# Patient Record
Sex: Male | Born: 1990 | Race: Black or African American | Hispanic: No | Marital: Single | Smoking: Current every day smoker
Health system: Southern US, Community
[De-identification: ages and names within clinical notes are randomized; demographics above are authoritative.]

---
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
## 2010-01-21 ENCOUNTER — Encounter: Payer: Self-pay | Admitting: Family Medicine

## 2010-02-05 ENCOUNTER — Ambulatory Visit: Payer: Self-pay | Admitting: Family Medicine

## 2010-02-05 DIAGNOSIS — F17200 Nicotine dependence, unspecified, uncomplicated: Secondary | ICD-10-CM | POA: Insufficient documentation

## 2010-02-05 DIAGNOSIS — M25519 Pain in unspecified shoulder: Secondary | ICD-10-CM

## 2011-01-08 NOTE — Miscellaneous (Signed)
Summary: input NP  Clinical Lists Changes  Observations: Added new observation of SOCIAL HX: Lives with great grandmother (doris 3) and cousin (logan 76) High school graduate Smoker (01/21/2010 16:45) Added new observation of FAMILY HX: none (01/21/2010 16:45) Added new observation of PAST SURG HX: none (01/21/2010 16:45) Added new observation of PAST MED HX: none (01/21/2010 16:45) Added new observation of MEDRECON: current updated (01/21/2010 16:45) Added new observation of SEATBELT USE: always (01/21/2010 16:45) Added new observation of REGULAREXERC: yes (01/21/2010 16:45) Added new observation of ALCOHOL USE: 0 /day (01/21/2010 16:45) Added new observation of SMOK STATUS: current  (01/21/2010 16:45)      Habits & Providers  Alcohol-Tobacco-Diet     Alcohol drinks/day: 0     Tobacco Status: current  Exercise-Depression-Behavior     Does Patient Exercise: yes     Seat Belt Use: always   Current Medications (verified): 1)  None   Past History:  Past Medical History: none  Past Surgical History: none   Family History: none  Social History: Lives with great grandmother (doris 66) and cousin (logan 7) High school graduate SmokerDoes Patient Exercise:  yes Risk analyst Use:  always Smoking Status:  current

## 2011-01-08 NOTE — Assessment & Plan Note (Signed)
Summary: NP,tcb   Vital Signs:  Patient profile:   20 year old male Height:      69 inches Weight:      183 pounds BMI:     27.12 Pulse rate:   83 / minute BP sitting:   130 / 90  (right arm)  Vitals Entered By: Arlyss Repress CMA, (February 05, 2010 9:13 AM) CC: new pt. check right shoulder. pt states: 'feels like my shoulder is coming out of place. but, it does not hurt' Is Patient Diabetic? No Pain Assessment Patient in pain? no        Primary Care Provider:  Eustaquio Boyden  MD  CC:  new pt. check right shoulder. pt states: 'feels like my shoulder is coming out of place. but and it does not hurt'.  History of Present Illness: CC: NP  Presents with Marylu Lund who he lived with during high school.  1. right shoulder - arm popping into and out of place.  Started when walking up steps in snow, slipped but caught holding rails, right shoulder felt like it "popped" out of place.  Had pain.  Since then notes shoulder popping in and out, but no pain.  current tobacco abuse, has tried to quit x 1. h/o MJ abuse. currently sexually active with one partner.  pt uses protection, partner not using birth control.  Habits & Providers  Alcohol-Tobacco-Diet     Alcohol drinks/day: 0     Tobacco Status: current     Tobacco Counseling: to quit use of tobacco products     Cigarette Packs/Day: 0.5  Exercise-Depression-Behavior     Drug Use: past     Seat Belt Use: always  Current Medications (verified): 1)  None  Allergies (verified): No Known Drug Allergies  Past History:  Past Surgical History: wisdom teeth removed x 4 (2004)  Family History: DM - uncles and aunts HTN - uncles and aunts CA - great aunt with lung and colon CA No MI, CVD, CAD  Social History: Lives with great grandmother (doris 89) and cousin (logan 1986) High school graduate, currently unemployed looking for job, 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 Smoker.  No EtOH, denies rec drugs.  h/o MJ use, in jail  for same 2009, abstinent since 12/2008.Packs/Day:  0.5 Drug Use:  past  Physical Exam  General:  Well-developed,well-nourished,in no acute distress; alert,appropriate and cooperative throughout examination Mouth:  Oral mucosa and oropharynx without lesions or exudates.  Teeth in good repair. Lungs:  Normal respiratory effort, chest expands symmetrically. Lungs are clear to auscultation, no crackles or wheezes. Heart:  Normal rate and regular rhythm. S1 and S2 normal without gallop, murmur, click, rub or other extra sounds. Abdomen:  Bowel sounds positive,abdomen soft and non-tender without masses, organomegaly or hernias noted. Msk:  Shoulder - no deformity or pain on palpation bilaterally.  No pain with crossover test bilaterally.  No pain or joint instability with stressing of right shoulder in abduction/external rotation.  Negative Seer's test, normal strength with testing of SITS.   Impression & Recommendations:  Problem # 1:  SHOULDER PAIN, RIGHT (ICD-719.41) not necessarily pain.  rather R shoulder instability/popping.  ? if ant dislocation but normal exam today.  provided with shoulder rehab, mainly abduction exercises, and advised to add weights as tolerated.  if continued complaint, return for rpt evaluation.  Problem # 2:  TOBACCO ABUSE (ICD-305.1) Encouraged smoking cessation and provided with pamphlet for group smokinjg cessation classes.  pt to return once serious about quitting.  average # of quit attempts before success in 5-15.  Patient Instructions: 1)  Please return as needed. 2)  Smoking cessation pamphlet provided.  Come see [place] if you want help with quitting/cutting back. 3)  Your shoulder looks healthy today, however it could have been a shoulder dislocation.  Try the shoulder strengthening exercises provided and come see [place] if your shoulder starts bothering you again. 4)  Call clinic with questions./  Pleasure to meet you today.

## 2014-10-04 ENCOUNTER — Encounter (HOSPITAL_COMMUNITY): Payer: Self-pay | Admitting: Emergency Medicine

## 2014-10-04 ENCOUNTER — Emergency Department (HOSPITAL_COMMUNITY)
Admission: EM | Admit: 2014-10-04 | Discharge: 2014-10-04 | Disposition: A | Discharge status: Home / Self Care | Payer: Self-pay | Source: Home / Self Care | Attending: Family Medicine | Admitting: Family Medicine

## 2014-10-04 DIAGNOSIS — M5442 Lumbago with sciatica, left side: Secondary | ICD-10-CM

## 2014-10-04 MED ORDER — CYCLOBENZAPRINE HCL 10 MG PO TABS: 10.0000 mg | ORAL_TABLET | Freq: Every evening | ORAL | Status: DC | PRN

## 2014-10-04 MED ORDER — PREDNISONE 10 MG PO KIT: PACK | ORAL | Status: DC

## 2014-10-04 NOTE — ED Provider Notes (Signed)
Randy Newton is a 23 y.o. male who presents to Urgent Care today for low back pain. Patient is left-sided low back pain radiating into the left leg present for about a month. No weakness or numbness by bladder dysfunction or difficulty walking. No injury. Patient has tried ibuprofen which helped some. No fevers or chills nausea vomiting or diarrhea. No urinary symptoms.   History reviewed. No pertinent past medical history. History  Substance Use Topics  . Smoking status: Current Every Day Smoker -- 0.50 packs/day    Types: Cigarettes  . Smokeless tobacco: Not on file  . Alcohol Use: No   ROS as above Medications: No current facility-administered medications for this encounter.   Current Outpatient Prescriptions  Medication Sig Dispense Refill  . cyclobenzaprine (FLEXERIL) 10 MG tablet Take 1 tablet (10 mg total) by mouth at bedtime as needed for muscle spasms.  20 tablet  0  . PredniSONE 10 MG KIT 12 day dose pack po  1 kit  0    Exam:  BP 133/78  Pulse 79  Temp(Src) 98.8 F (37.1 C) (Oral)  Resp 12  SpO2 100% Gen: Well NAD HEENT: EOMI,  MMM Lungs: Normal work of breathing. CTABL Heart: RRR no MRG Abd: NABS, Soft. Nondistended, Nontender Exts: Brisk capillary refill, warm and well perfused.  Back: Nontender to spinal midline. Tender palpation left SI joint. Negative straight leg raise test and Faber test bilaterally. Low back range of motion is intact. \ With forward flexion patient's right posterior chest wall appears to be elevated with possible scoliosis. Lower extremity reflexes are equal bilaterally and normal. Work history strength is intact throughout. Sensation is intact throughout  No results found for this or any previous visit (from the past 24 hour(s)). No results found.  Assessment and Plan: 23 y.o. male with lumbago and sciatica to the left side. Treatment with prednisone Dosepak and Flexeril. Heating pad and home exercise program. Follow-up with  primary care provider as needed.  Discussed warning signs or symptoms. Please see discharge instructions. Patient expresses understanding.     Gregor Hams, MD 10/04/14 [phone]

## 2014-10-04 NOTE — ED Notes (Signed)
Reports lower back pain with left leg pain x 1 month off/on.  Denies injury.  No relief with otc pain meds.  Denies urinary symptoms.

## 2014-10-04 NOTE — Discharge Instructions (Signed)
Thank you for coming in today. Come back or go to the emergency room if you notice new weakness new numbness problems walking or bowel or bladder problems. Do not drive or work around heavy machines after taking flexeril.  Sciatica Sciatica is pain, weakness, numbness, or tingling along the path of the sciatic nerve. The nerve starts in the lower back and runs down the back of each leg. The nerve controls the muscles in the lower leg and in the back of the knee, while also providing sensation to the back of the thigh, lower leg, and the sole of your foot. Sciatica is a symptom of another medical condition. For instance, nerve damage or certain conditions, such as a herniated disk or bone spur on the spine, pinch or put pressure on the sciatic nerve. This causes the pain, weakness, or other sensations normally associated with sciatica. Generally, sciatica only affects one side of the body. CAUSES   Herniated or slipped disc.  Degenerative disk disease.  A pain disorder involving the narrow muscle in the buttocks (piriformis syndrome).  Pelvic injury or fracture.  Pregnancy.  Tumor (rare). SYMPTOMS  Symptoms can vary from mild to very severe. The symptoms usually travel from the low back to the buttocks and down the back of the leg. Symptoms can include:  Mild tingling or dull aches in the lower back, leg, or hip.  Numbness in the back of the calf or sole of the foot.  Burning sensations in the lower back, leg, or hip.  Sharp pains in the lower back, leg, or hip.  Leg weakness.  Severe back pain inhibiting movement. These symptoms may get worse with coughing, sneezing, laughing, or prolonged sitting or standing. Also, being overweight may worsen symptoms. DIAGNOSIS  Your caregiver will perform a physical exam to look for common symptoms of sciatica. He or she may ask you to do certain movements or activities that would trigger sciatic nerve pain. Other tests may be performed to find  the cause of the sciatica. These may include:  Blood tests.  X-rays.  Imaging tests, such as an MRI or CT scan. TREATMENT  Treatment is directed at the cause of the sciatic pain. Sometimes, treatment is not necessary and the pain and discomfort goes away on its own. If treatment is needed, your caregiver may suggest:  Over-the-counter medicines to relieve pain.  Prescription medicines, such as anti-inflammatory medicine, muscle relaxants, or narcotics.  Applying heat or ice to the painful area.  Steroid injections to lessen pain, irritation, and inflammation around the nerve.  Reducing activity during periods of pain.  Exercising and stretching to strengthen your abdomen and improve flexibility of your spine. Your caregiver may suggest losing weight if the extra weight makes the back pain worse.  Physical therapy.  Surgery to eliminate what is pressing or pinching the nerve, such as a bone spur or part of a herniated disk. HOME CARE INSTRUCTIONS   Only take over-the-counter or prescription medicines for pain or discomfort as directed by your caregiver.  Apply ice to the affected area for 20 minutes, 3-4 times a day for the first 48-72 hours. Then try heat in the same way.  Exercise, stretch, or perform your usual activities if these do not aggravate your pain.  Attend physical therapy sessions as directed by your caregiver.  Keep all follow-up appointments as directed by your caregiver.  Do not wear high heels or shoes that do not provide proper support.  Check your mattress to see if  it is too soft. A firm mattress may lessen your pain and discomfort. SEEK IMMEDIATE MEDICAL CARE IF:   You lose control of your bowel or bladder (incontinence).  You have increasing weakness in the lower back, pelvis, buttocks, or legs.  You have redness or swelling of your back.  You have a burning sensation when you urinate.  You have pain that gets worse when you lie down or awakens  you at night.  Your pain is worse than you have experienced in the past.  Your pain is lasting longer than 4 weeks.  You are suddenly losing weight without reason. MAKE SURE YOU:  Understand these instructions.  Will watch your condition.  Will get help right away if you are not doing well or get worse. Document Released: 11/19/2001 Document Revised: 05/26/2012 Document Reviewed: 04/05/2012 [facility] Patient Information 2015 [employer], [place]. This information is not intended to replace advice given to you by your health care provider. Make sure you discuss any questions you have with your health care provider.

## 2015-09-11 ENCOUNTER — Ambulatory Visit
Admission: RE | Admit: 2015-09-11 | Discharge: 2015-09-11 | Disposition: A | Discharge status: Home / Self Care | Payer: 59 | Source: Ambulatory Visit | Attending: Family Medicine | Admitting: Family Medicine

## 2015-09-11 ENCOUNTER — Other Ambulatory Visit: Payer: Self-pay | Admitting: Family Medicine

## 2015-09-11 DIAGNOSIS — M545 Low back pain: Secondary | ICD-10-CM

## 2016-08-22 IMAGING — CR DG LUMBAR SPINE 2-3V
3 series · 3 of 3 positions shown · non-contrast
Comparison: None in PACs

CLINICAL DATA: Chronic low back pain with increasing frequency and
severity of back pain and left leg pain common no known injury.

EXAM:
LUMBAR SPINE - 2-3 VIEW

[t l-spine a.p.]
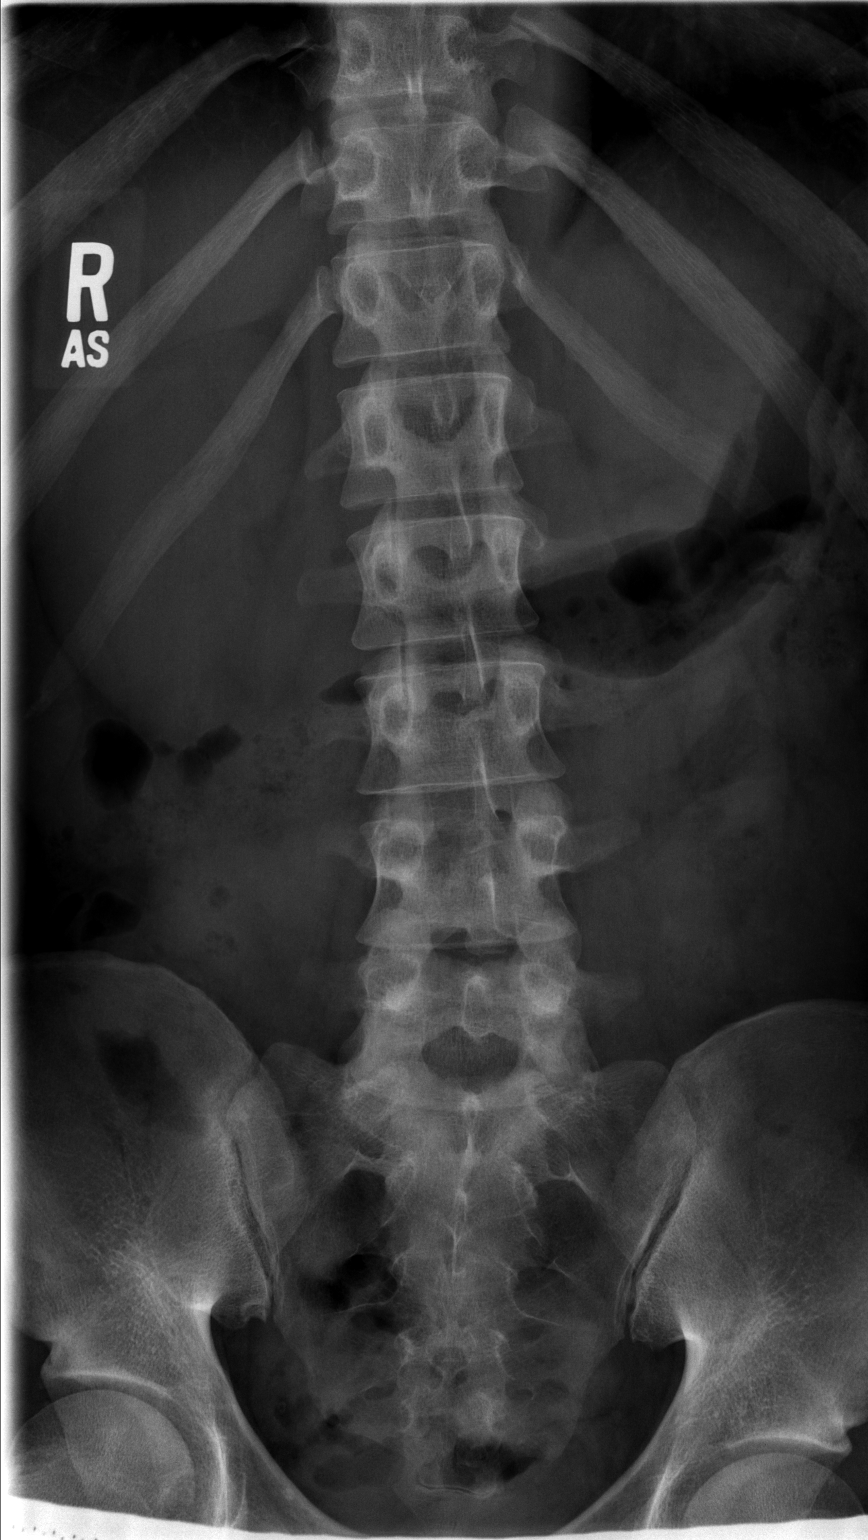

[t l-spine lat]
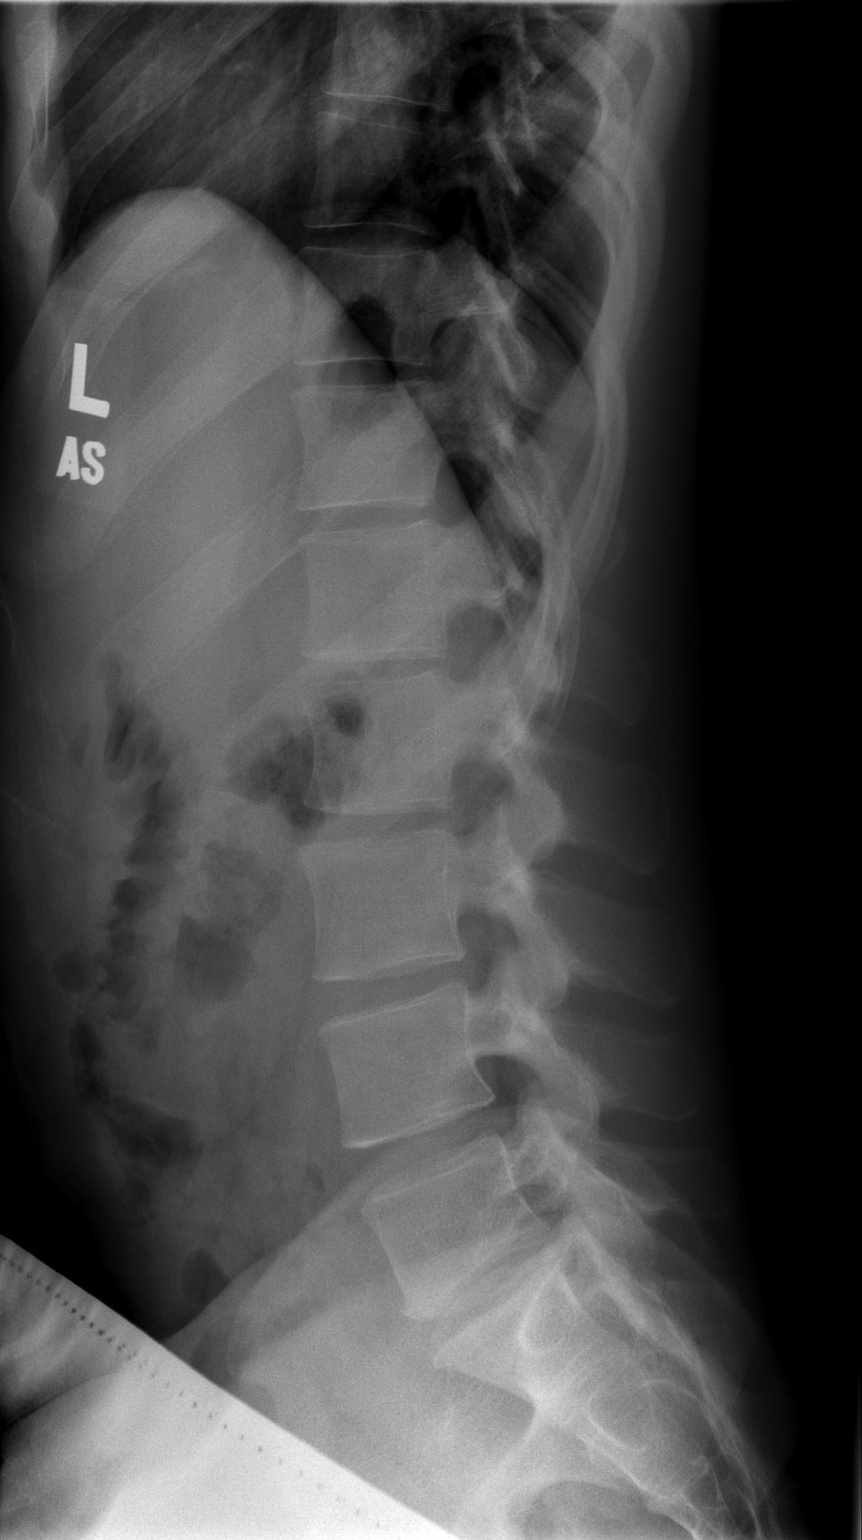

[t l-spine l5-s1 spot]
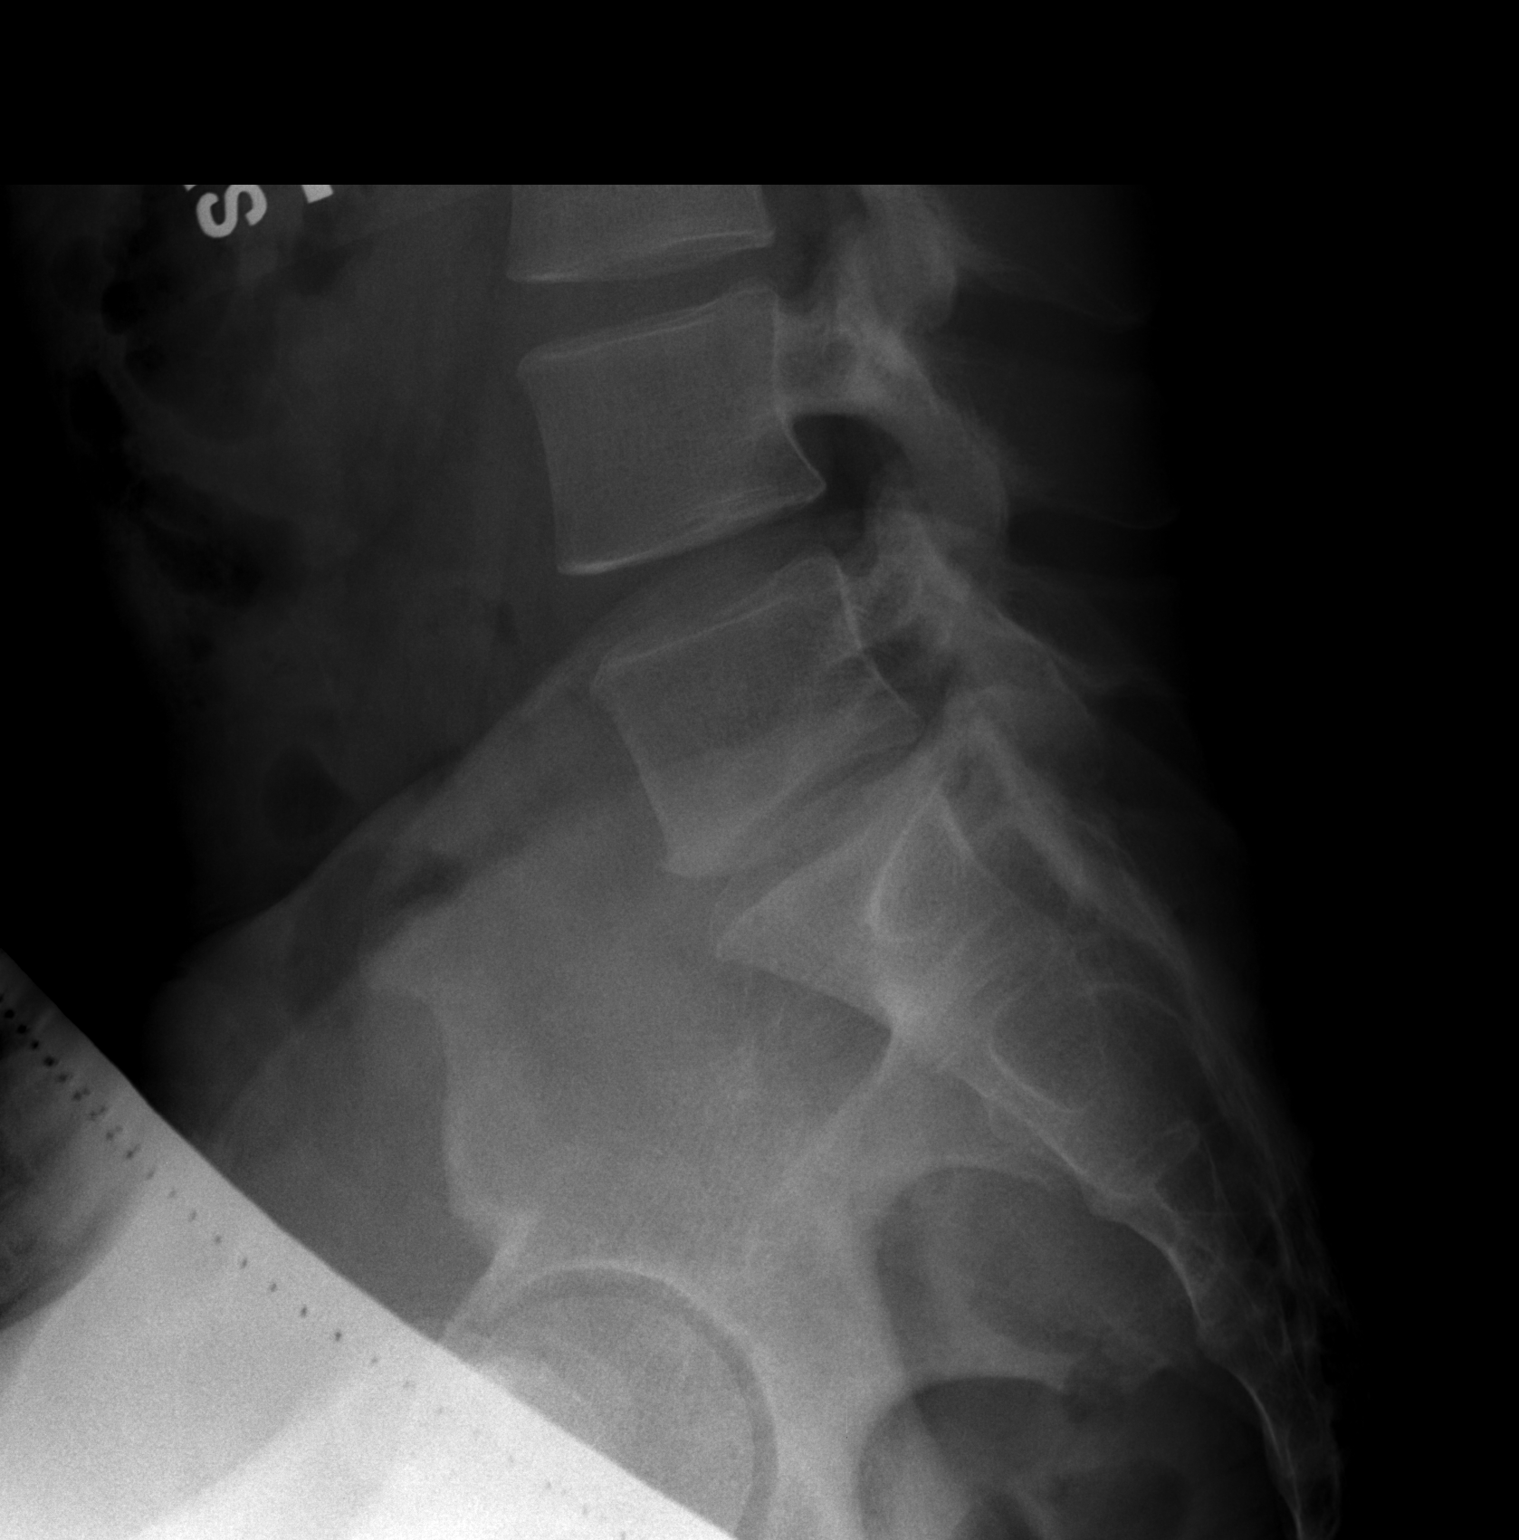

[3 of 3 positions shown; findings below may reference images not displayed]

FINDINGS: The lumbar vertebral bodies are preserved in height. The disc space
heights are reasonably well-maintained. There is gentle
levocurvature with centered at L4-5. There is no spondylolisthesis.
The pedicles and transverse processes are intact. The observed
portions of the sacrum are normal.
IMPRESSION: There is no acute or significant chronic bony abnormality of the
lumbar spine. If there are true radicular symptoms, lumbar spine MRI
may be useful.

## 2019-07-18 ENCOUNTER — Emergency Department (HOSPITAL_COMMUNITY)
Admission: EM | Admit: 2019-07-18 | Discharge: 2019-07-18 | Disposition: A | Discharge status: Home / Self Care | Payer: BLUE CROSS/BLUE SHIELD | Attending: Emergency Medicine | Admitting: Emergency Medicine

## 2019-07-18 ENCOUNTER — Encounter (HOSPITAL_COMMUNITY): Payer: Self-pay

## 2019-07-18 ENCOUNTER — Other Ambulatory Visit: Payer: Self-pay

## 2019-07-18 DIAGNOSIS — M5442 Lumbago with sciatica, left side: Secondary | ICD-10-CM | POA: Diagnosis not present

## 2019-07-18 DIAGNOSIS — F17210 Nicotine dependence, cigarettes, uncomplicated: Secondary | ICD-10-CM | POA: Insufficient documentation

## 2019-07-18 DIAGNOSIS — Z79899 Other long term (current) drug therapy: Secondary | ICD-10-CM | POA: Diagnosis not present

## 2019-07-18 DIAGNOSIS — M545 Low back pain: Secondary | ICD-10-CM | POA: Diagnosis present

## 2019-07-18 MED ORDER — NAPROXEN 500 MG PO TABS: 500.0000 mg | ORAL_TABLET | Freq: Two times a day (BID) | ORAL | 0 refills | Status: DC

## 2019-07-18 MED ORDER — METHOCARBAMOL 500 MG PO TABS: 500.0000 mg | ORAL_TABLET | Freq: Two times a day (BID) | ORAL | 0 refills | Status: DC

## 2019-07-18 MED ORDER — PREDNISONE 20 MG PO TABS
60.0000 mg | ORAL_TABLET | Freq: Once | ORAL | Status: AC
  Administered 2019-07-18: 60 mg via ORAL
  Filled 2019-07-18: qty 3

## 2019-07-18 MED ORDER — METHYLPREDNISOLONE 4 MG PO TBPK: ORAL_TABLET | ORAL | 0 refills | Status: DC

## 2019-07-18 MED ORDER — NAPROXEN 250 MG PO TABS
500.0000 mg | ORAL_TABLET | Freq: Once | ORAL | Status: AC
  Administered 2019-07-18: 12:00:00 500 mg via ORAL
  Filled 2019-07-18: qty 2

## 2019-07-18 MED ORDER — METHOCARBAMOL 500 MG PO TABS
500.0000 mg | ORAL_TABLET | Freq: Once | ORAL | Status: AC
  Administered 2019-07-18: 12:00:00 500 mg via ORAL
  Filled 2019-07-18: qty 1

## 2019-07-18 MED ORDER — LIDOCAINE 5 % EX PTCH
1.0000 | MEDICATED_PATCH | CUTANEOUS | Status: DC
  Administered 2019-07-18: 12:00:00 1 via TRANSDERMAL
  Filled 2019-07-18: qty 1

## 2019-07-18 NOTE — ED Triage Notes (Signed)
Patient complains of lower back pain with radiation down left leg since wednesday. Hx of same, denies trauma

## 2019-07-18 NOTE — ED Notes (Signed)
Provider notified of patient's blood pressure able to discharge patient will follow up with doctor.

## 2019-07-18 NOTE — ED Provider Notes (Signed)
[facility] EMERGENCY DEPARTMENT Provider Note   CSN: [id number] Arrival date & time: 07/18/19  6967    History   Chief Complaint Chief Complaint  Patient presents with  . Back Pain    HPI Randy Newton is a 28 y.o. male.     Randy Newton is a 28 y.o. male with a history of previous sciatica, shoulder pain and tobacco abuse, who presents to the emergency department for evaluation of low back pain that radiates down into the left leg.  He reports pain started on Wednesday morning when he woke up, and wonders if he may have slept on.  Reports this feels exactly like when he had a sciatica a few years ago.  He reports with forward bending and certain movements pain seems to shoot down his left leg.  He denies any numbness or weakness.  No loss of bowel or bladder control.  Denies any trauma or injury to the back.  No fevers.  No abdominal pain, no urinary symptoms.  No loss of bowel or bladder control.  He has taken some over-the-counter anti-inflammatories without much improvement, no other aggravating or alleviating factors.  No prior back surgeries.     History reviewed. No pertinent past medical history.  Patient Active Problem List   Diagnosis Date Noted  . TOBACCO ABUSE 02/05/2010  . SHOULDER PAIN, RIGHT 02/05/2010    History reviewed. No pertinent surgical history.      Home Medications    Prior to Admission medications   Medication Sig Start Date End Date Taking? Authorizing Provider  cyclobenzaprine (FLEXERIL) 10 MG tablet Take 1 tablet (10 mg total) by mouth at bedtime as needed for muscle spasms. 10/04/14   Gregor Hams, MD  methocarbamol (ROBAXIN) 500 MG tablet Take 1 tablet (500 mg total) by mouth 2 (two) times daily. 07/18/19   Jacqlyn Larsen, PA-C  methylPREDNISolone (MEDROL DOSEPAK) 4 MG TBPK tablet Take as directed 07/18/19   Jacqlyn Larsen, PA-C  naproxen (NAPROSYN) 500 MG tablet Take 1 tablet (500 mg total) by mouth 2 (two) times  daily. 07/18/19   Jacqlyn Larsen, PA-C  PredniSONE 10 MG KIT 12 day dose pack po 10/04/14   Gregor Hams, MD    Family History No family history on file.  Social History Social History   Tobacco Use  . Smoking status: Current Every Day Smoker    Packs/day: 0.50    Types: Cigarettes  Substance Use Topics  . Alcohol use: No  . Drug use: No     Allergies   Patient has no known allergies.   Review of Systems Review of Systems  Constitutional: Negative for chills and fever.  HENT: Negative.   Respiratory: Negative for shortness of breath.   Cardiovascular: Negative for chest pain.  Gastrointestinal: Negative for abdominal pain, constipation, diarrhea, nausea and vomiting.  Genitourinary: Negative for dysuria, flank pain, frequency and hematuria.  Musculoskeletal: Positive for back pain. Negative for arthralgias, gait problem, joint swelling, myalgias and neck pain.  Skin: Negative for color change, rash and wound.  Neurological: Negative for weakness and numbness.     Physical Exam Updated Vital Signs BP (!) 165/112   Pulse 90   Temp 99.2 F (37.3 C) (Oral)   Resp 18   SpO2 100%   Physical Exam Vitals signs and nursing note reviewed.  Constitutional:      General: He is not in acute distress.    Appearance: He is well-developed. He  is not diaphoretic.  HENT:     Head: Atraumatic.  Eyes:     General:        Right eye: No discharge.        Left eye: No discharge.  Neck:     Musculoskeletal: Neck supple.  Cardiovascular:     Pulses:          Radial pulses are 2+ on the right side and 2+ on the left side.       Dorsalis pedis pulses are 2+ on the right side and 2+ on the left side.       Posterior tibial pulses are 2+ on the right side and 2+ on the left side.  Pulmonary:     Effort: Pulmonary effort is normal. No respiratory distress.  Abdominal:     General: Bowel sounds are normal. There is no distension.     Palpations: Abdomen is soft. There is no mass.      Tenderness: There is no abdominal tenderness. There is no guarding.     Comments: Abdomen soft, nondistended, nontender to palpation in all quadrants without guarding or peritoneal signs, no CVA tenderness bilaterally  Musculoskeletal:     Comments: Tenderness to palpation over left low back.  Pain made worse with range of motion of the lower extremities, positive straight leg raise on the left.  Skin:    General: Skin is warm and dry.     Capillary Refill: Capillary refill takes less than 2 seconds.  Neurological:     Mental Status: He is alert and oriented to person, place, and time.     Comments: Alert, clear speech, following commands. Moving all extremities without difficulty. Bilateral lower extremities with 5/5 strength in proximal and distal muscle groups and with dorsi and plantar flexion. Sensation intact in bilateral lower extremities. 2+ patellar DTRs bilaterally. Ambulatory with steady gait  Psychiatric:        Behavior: Behavior normal.      ED Treatments / Results  Labs (all labs ordered are listed, but only abnormal results are displayed) Labs Reviewed - No data to display  EKG None  Radiology No results found.  Procedures Procedures (including critical care time)  Medications Ordered in ED Medications  lidocaine (LIDODERM) 5 % 1 patch (1 patch Transdermal Patch Applied 07/18/19 1223)  naproxen (NAPROSYN) tablet 500 mg (500 mg Oral Given 07/18/19 1223)  methocarbamol (ROBAXIN) tablet 500 mg (500 mg Oral Given 07/18/19 1223)  predniSONE (DELTASONE) tablet 60 mg (60 mg Oral Given 07/18/19 1222)     Initial Impression / Assessment and Plan / ED Course  I have reviewed the triage vital signs and the nursing notes.  Pertinent labs & imaging results that were available during my care of the patient were reviewed by me and considered in my medical decision making (see chart for details).  Normal neurological exam, no evidence of urinary incontinence or  retention, pain is consistently reproducible. There is no evidence of AAA or concern for dissection at this time.   Patient can walk but states is painful.  No loss of bowel or bladder control.  No concern for cauda equina.  No fever, night sweats, weight loss, h/o cancer, IVDU.  Pain treated here in the department with adequate improvement. RICE protocol and pain medicine indicated and discussed with patient. I have also discussed reasons to return immediately to the ER.  Patient expresses understanding and agrees with plan.   Final Clinical Impressions(s) / ED Diagnoses  Final diagnoses:  Acute left-sided low back pain with left-sided sciatica    ED Discharge Orders         Ordered    naproxen (NAPROSYN) 500 MG tablet  2 times daily     07/18/19 1306    methocarbamol (ROBAXIN) 500 MG tablet  2 times daily     07/18/19 1306    methylPREDNISolone (MEDROL DOSEPAK) 4 MG TBPK tablet     07/18/19 1306           Jacqlyn Larsen, [place] 07/18/19 1324    Virgel Manifold, MD 07/21/19 2001

## 2019-07-18 NOTE — Discharge Instructions (Signed)
You were seen here today for Back Pain: Low back pain is discomfort in the lower back that may be due to injuries to muscles and ligaments around the spine. Occasionally, it may be caused by a problem to a part of the spine called a disc. Your back pain should be treated with medicines listed below as well as back exercises and this back pain should get better over the next 2 weeks. Most patients get completely well in 4 weeks. It is important to know however, if you develop severe or worsening pain, low back pain with fever, numbness, weakness or inability to walk or urinate, you should return to the ER immediately.  Please follow up with your doctor this week for a recheck if still having symptoms.  HOME INSTRUCTIONS Self - care:  The application of heat can help soothe the pain.  Maintaining your daily activities, including walking (this is encouraged), as it will help you get better faster than just staying in bed. Do not life, push, pull anything more than 10 pounds for the next week. I am attaching back exercises that you can do at home to help facilitate your recovery.   Back Exercises - I have attached a handout on back exercises that can be done at home to help facilitate your recovery.   Medications are also useful to help with pain control.   Acetaminophen.  This medication is generally safe, and found over the counter. Take as directed for your age. You should not take more than 8 of the extra strength (500mg ) pills a day (max dose is 4000mg  total OVER one day)  Non steroidal anti inflammatory: This includes medications including Ibuprofen, naproxen and Mobic; These medications help both pain and swelling and are very useful in treating back pain.  They should be taken with food, as they can cause stomach upset, and more seriously, stomach bleeding. Do not combine the medications.   Muscle relaxants:  These medications can help with muscle tightness that is a cause of lower back pain.  Most  of these medications can cause drowsiness, and it is not safe to drive or use dangerous machinery while taking them. They are primarily helpful when taken at night before sleep.  Prednisone - This is an oral steroid.  This medication is best taken with food in the morning.  Please note that this medication can cause anxiety, mood swings, muscle fatigue, increased hunger, weight gain (sodium/fluid retention), poor sleep as well as other symptoms. If you are a diabetic, please monitor your blood sugars at home as this medication can increase your blood sugars. Call your pharmacist if you have any questions.  You will need to follow up with your primary healthcare provider in 1-2 weeks for reassessment and persistent symptoms.  Be aware that if you develop new symptoms, such as a fever, leg weakness, difficulty with or loss of control of your urine or bowels, abdominal pain, or more severe pain, you will need to seek medical attention and/or return to the Emergency department. Additional Information:  Your vital signs today were: BP (!) 172/121    Pulse 90    Temp 99.2 F (37.3 C) (Oral)    Resp 18    SpO2 100%  If your blood pressure (BP) was elevated above 135/85 this visit, please have this repeated by your doctor within one month. ---------------

## 2019-07-23 ENCOUNTER — Other Ambulatory Visit: Payer: Self-pay

## 2019-07-23 ENCOUNTER — Encounter (HOSPITAL_COMMUNITY): Payer: Self-pay | Admitting: Emergency Medicine

## 2019-07-23 ENCOUNTER — Emergency Department (HOSPITAL_COMMUNITY)
Admission: EM | Admit: 2019-07-23 | Discharge: 2019-07-23 | Disposition: A | Discharge status: Home / Self Care | Payer: BLUE CROSS/BLUE SHIELD | Attending: Emergency Medicine | Admitting: Emergency Medicine

## 2019-07-23 DIAGNOSIS — F17210 Nicotine dependence, cigarettes, uncomplicated: Secondary | ICD-10-CM | POA: Insufficient documentation

## 2019-07-23 DIAGNOSIS — M5442 Lumbago with sciatica, left side: Secondary | ICD-10-CM | POA: Insufficient documentation

## 2019-07-23 DIAGNOSIS — M545 Low back pain: Secondary | ICD-10-CM | POA: Diagnosis present

## 2019-07-23 MED ORDER — KETOROLAC TROMETHAMINE 15 MG/ML IJ SOLN
15.0000 mg | Freq: Once | INTRAMUSCULAR | Status: AC
  Administered 2019-07-23: 15 mg via INTRAMUSCULAR
  Filled 2019-07-23: qty 1

## 2019-07-23 MED ORDER — LIDOCAINE 5 % EX PTCH: 1.0000 | MEDICATED_PATCH | CUTANEOUS | 0 refills | Status: AC

## 2019-07-23 MED ORDER — PREDNISONE 10 MG (21) PO TBPK: ORAL_TABLET | Freq: Every day | ORAL | 0 refills | Status: AC

## 2019-07-23 NOTE — Discharge Instructions (Signed)
Take prednisone as prescribed.  Take entire course, even if your symptoms improve. Take naproxen 2 times a day with meals.  Do not take other anti-inflammatories at the same time (Advil, Motrin, ibuprofen, Aleve). You may supplement with Tylenol if you need further pain control. Use Robaxin as needed for muscle stiffness or soreness. Have caution, as this may make you tired or groggy. Do not driver or operate heavy machinery while taking this medication.  Use muscle creams (bengay, icy hot, salonpas) as needed for pain.  Do the stretches listed in the paperwork. Use the Lidoderm patches for pain.  If they are too expensive, you may get the over-the-counter option, discussed with the pharmacist. Follow-up with the orthopedic doctor listed below if pain is not improving. Return to the ER if you develop high fevers, numbness, loss of bowel or bladder control, or any new or concerning symptoms.

## 2019-07-23 NOTE — ED Provider Notes (Signed)
[facility]   CSN: [id number] Arrival date & time: 07/23/19  [id number]     History   Chief Complaint Chief Complaint  Patient presents with  . Back Pain    HPI Randy Newton is a 28 y.o. male presenting for evaluation of left low back and hip pain.  Patient states approximately 10 days ago he started develop pain in his left low back and hip.  He was seen 5 days ago for the same, diagnosed with sciatica.  Started on prednisone, muscle relaxers, and naproxen.  He reports pain is improving, but not resolved.  He finishes prednisone yesterday.  He denies fall, trauma, or injury.  He denies fevers, chills, numbness, tingling, loss of bowel bladder control, history of cancer, history of IVDU.  Pain is worse when he stands and bends, but no pain at rest.  He reports no other medical problems, takes no medications daily.      HPI  History reviewed. No pertinent past medical history.  Patient Active Problem List   Diagnosis Date Noted  . TOBACCO ABUSE 02/05/2010  . SHOULDER PAIN, RIGHT 02/05/2010    No past surgical history on file.      Home Medications    Prior to Admission medications   Medication Sig Start Date End Date Taking? Authorizing Provider  lidocaine (LIDODERM) 5 % Place 1 patch onto the skin daily. Remove & Discard patch within 12 hours or as directed by MD 07/23/19   Rasul Decola, PA-C  predniSONE (STERAPRED UNI-PAK 21 TAB) 10 MG (21) TBPK tablet Take by mouth daily. Take 6 tabs by mouth daily  for 2 days, then 5 tabs for 2 days, then 4 tabs for 2 days, then 3 tabs for 2 days, 2 tabs for 2 days, then 1 tab by mouth daily for 2 days 07/23/19   Randy Cabiness, PA-C    Family History No family history on file.  Social History Social History   Tobacco Use  . Smoking status: Current Every Day Smoker    Packs/day: 0.50    Types: Cigarettes  Substance Use Topics  . Alcohol use: No  . Drug use: No      Allergies   Patient has no known allergies.   Review of Systems Review of Systems  Musculoskeletal: Positive for back pain.  All other systems reviewed and are negative.    Physical Exam Updated Vital Signs BP (!) 158/116 (BP Location: Right Arm)   Pulse 88   Temp 98.1 F (36.7 C) (Oral)   Resp 14   SpO2 99%   Physical Exam Vitals signs and nursing note reviewed.  Constitutional:      General: He is not in acute distress.    Appearance: He is well-developed.     Comments: Sitting in the bed in no acute distress  HENT:     Head: Normocephalic and atraumatic.  Eyes:     Conjunctiva/sclera: Conjunctivae normal.     Pupils: Pupils are equal, round, and reactive to light.  Neck:     Musculoskeletal: Normal range of motion and neck supple.  Cardiovascular:     Rate and Rhythm: Normal rate and regular rhythm.  Pulmonary:     Effort: Pulmonary effort is normal. No respiratory distress.     Breath sounds: Normal breath sounds. No wheezing.  Abdominal:     General: There is no distension.     Palpations: Abdomen is soft. There is no mass.  Tenderness: There is no abdominal tenderness. There is no guarding or rebound.  Musculoskeletal: Normal range of motion.     Comments: Tenderness to palpation of left lateral hip.  Positive straight leg raise.  Pedal pulses intact laterally.  Distal lower extremity sensation intact bilaterally.  Patellar reflexes equal bilaterally.  No ttp of low back at this time. Pt able to stand and ambulate with minimal pain.   Skin:    General: Skin is warm and dry.  Neurological:     Mental Status: He is alert and oriented to person, place, and time.      ED Treatments / Results  Labs (all labs ordered are listed, but only abnormal results are displayed) Labs Reviewed - No data to display  EKG None  Radiology No results found.  Procedures Procedures (including critical care time)  Medications Ordered in ED Medications  ketorolac  (TORADOL) 15 MG/ML injection 15 mg (15 mg Intramuscular Given 07/23/19 1009)     Initial Impression / Assessment and Plan / ED Course  I have reviewed the triage vital signs and the nursing notes.  Pertinent labs & imaging results that were available during my care of the patient were reviewed by me and considered in my medical decision making (see chart for details).        Patient presenting for evaluation of left low back/hip pain.  Physical exam reassuring, he is neurovascularly intact.  No red flags of back pain.  History is consistent with sciatica.  Doubt fracture, I do not believe x-rays will be beneficial.  Doubt vertebral injury, infection, spinal cord compression, myelopathy, or cauda equina syndrome.  As symptoms are improving, will extend course of steroids and have patient continue with muscle relaxers.  Will give Lidoderm patches for further pain control.  Patient given information to follow-up with orthopedics is not improving, as he may benefit from PT or local injections.  At this time, patient appears safe for discharge.  Return precautions given.  Patient states he understands and agrees to plan.   Final Clinical Impressions(s) / ED Diagnoses   Final diagnoses:  Acute left-sided low back pain with left-sided sciatica    ED Discharge Orders         Ordered    predniSONE (STERAPRED UNI-PAK 21 TAB) 10 MG (21) TBPK tablet  Daily     07/23/19 0932    lidocaine (LIDODERM) 5 %  Every 24 hours     07/23/19 0932           Franchot Heidelberg, PA-C 07/23/19 1138    Hayden Rasmussen, MD 07/24/19 [id number]

## 2019-07-23 NOTE — ED Triage Notes (Signed)
Pt reports left sided back pain radiating into left hip and leg. Pt was also seen here on 8/09 states pain is better with sitting but still painful with walking.

## 2019-07-23 NOTE — ED Notes (Signed)
Patient verbalizes understanding of discharge instructions. Opportunity for questioning and answers were provided. Armband removed by staff, pt discharged from ED.  

## 2024-12-05 ENCOUNTER — Ambulatory Visit: Admission: EM | Admit: 2024-12-05 | Discharge: 2024-12-05 | Disposition: A | Discharge status: Home / Self Care

## 2024-12-05 DIAGNOSIS — J069 Acute upper respiratory infection, unspecified: Secondary | ICD-10-CM

## 2024-12-05 DIAGNOSIS — J029 Acute pharyngitis, unspecified: Secondary | ICD-10-CM | POA: Diagnosis not present

## 2024-12-05 DIAGNOSIS — R070 Pain in throat: Secondary | ICD-10-CM

## 2024-12-05 LAB — POCT RAPID STREP A (OFFICE): Rapid Strep A Screen: NEGATIVE

## 2024-12-05 MED ORDER — ONDANSETRON 8 MG PO TBDP: 8.0000 mg | ORAL_TABLET | Freq: Three times a day (TID) | ORAL | 0 refills | Status: AC | PRN

## 2024-12-05 NOTE — ED Triage Notes (Addendum)
 Idris states he drunk too much alcohol last Saturday, Sunday he could not keep food down and vomiting and this continued during the week. He is experiencing light headed, unable to drink or eat and irritated, sore throat. Treated with Nyquil and Mucinex for sore throat and he is still experiencing the same symptoms. Hurts to swallow. He has not drank alcohol since last weekend.

## 2024-12-05 NOTE — ED Provider Notes (Signed)
 " [facility]    CSN: [id number] Arrival date & time: 12/05/24  1432      History   Chief Complaint No chief complaint on file.   HPI Randy Newton is a 33 y.o. male.   Pt presents today due to one week of throat pain, difficulty swallowing, and reduced appetite. Pt states that he thought his symptoms might have been due to overindulgence in alcohol 1 week ago. Pt has been sipping Nyquil so it could coat his throat. Pt denies known sick contacts.   The history is provided by the patient.    History reviewed. No pertinent past medical history.  Patient Active Problem List   Diagnosis Date Noted   TOBACCO ABUSE 02/05/2010   SHOULDER PAIN, RIGHT 02/05/2010    History reviewed. No pertinent surgical history.     Home Medications    Prior to Admission medications  Medication Sig Start Date End Date Taking? Authorizing Provider  lidocaine  (LIDODERM ) 5 % Place 1 patch onto the skin daily. Remove & Discard patch within 12 hours or as directed by MD 07/23/19   Caccavale, Sophia, PA-C  predniSONE  (STERAPRED UNI-PAK 21 TAB) 10 MG (21) TBPK tablet Take by mouth daily. Take 6 tabs by mouth daily  for 2 days, then 5 tabs for 2 days, then 4 tabs for 2 days, then 3 tabs for 2 days, 2 tabs for 2 days, then 1 tab by mouth daily for 2 days 07/23/19   Caccavale, Sophia, PA-C    Family History No family history on file.  Social History Social History[1]   Allergies   Patient has no known allergies.   Review of Systems Review of Systems   Physical Exam Triage Vital Signs ED Triage Vitals  Encounter Vitals Group     BP 12/05/24 1625 (!) 124/92     Girls Systolic BP Percentile --      Girls Diastolic BP Percentile --      Boys Systolic BP Percentile --      Boys Diastolic BP Percentile --      Pulse Rate 12/05/24 1625 (!) 109     Resp --      Temp 12/05/24 1625 99.1 F (37.3 C)     Temp Source 12/05/24 1625 Oral     SpO2 12/05/24 1625 100 %     Weight  12/05/24 1622 160 lb (72.6 kg)     Height 12/05/24 1622 5' 10 (1.778 m)     Head Circumference --      Peak Flow --      Pain Score 12/05/24 1622 9     Pain Loc --      Pain Education --      Exclude from Growth Chart --    No data found.  Updated Vital Signs BP (!) 124/92 (BP Location: Left Arm)   Pulse (!) 109   Temp 99.1 F (37.3 C) (Oral)   Ht 5' 10 (1.778 m)   Wt 160 lb (72.6 kg)   SpO2 100%   BMI 22.96 kg/m   Visual Acuity Right Eye Distance:   Left Eye Distance:   Bilateral Distance:    Right Eye Near:   Left Eye Near:    Bilateral Near:     Physical Exam Vitals and nursing note reviewed.  Constitutional:      General: He is not in acute distress.    Appearance: Normal appearance. He is not ill-appearing, toxic-appearing or diaphoretic.  HENT:  Mouth/Throat:     Pharynx: Posterior oropharyngeal erythema (mild-mod) present. No oropharyngeal exudate.  Eyes:     General: No scleral icterus. Cardiovascular:     Rate and Rhythm: Normal rate and regular rhythm.     Heart sounds: Normal heart sounds.  Pulmonary:     Effort: Pulmonary effort is normal. No respiratory distress.     Breath sounds: Normal breath sounds. No wheezing or rhonchi.  Musculoskeletal:     Cervical back: No tenderness.  Lymphadenopathy:     Cervical: No cervical adenopathy.  Skin:    General: Skin is warm.  Neurological:     Mental Status: He is alert and oriented to person, place, and time.  Psychiatric:        Mood and Affect: Mood normal.        Behavior: Behavior normal.      UC Treatments / Results  Labs (all labs ordered are listed, but only abnormal results are displayed) Labs Reviewed - No data to display  EKG   Radiology No results found.  Procedures Procedures (including critical care time)  Medications Ordered in UC Medications - No data to display  Initial Impression / Assessment and Plan / UC Course  I have reviewed the triage vital signs and the  nursing notes.  Pertinent labs & imaging results that were available during my care of the patient were reviewed by me and considered in my medical decision making (see chart for details).     Final Clinical Impressions(s) / UC Diagnoses   Final diagnoses:  Throat pain   Discharge Instructions   None    ED Prescriptions   None    PDMP not reviewed this encounter.    [1]  Social History Tobacco Use   Smoking status: Every Day    Current packs/day: 0.50    Types: Cigarettes  Substance Use Topics   Alcohol use: Yes    Comment: 40 ounce beer a day at a minumum.   Drug use: Yes    Types: Marijuana     Andra Corean BROCKS, PA-C 12/05/24 1701  "

## 2024-12-05 NOTE — Discharge Instructions (Signed)
